# Patient Record
Sex: Male | Born: 1970 | Race: Black or African American | Hispanic: No | Marital: Married | Smoking: Never smoker
Health system: Southern US, Community
[De-identification: ages and names within clinical notes are randomized; demographics above are authoritative.]

## PROBLEM LIST (undated history)

## (undated) HISTORY — PX: KNEE SURGERY: SHX244

## (undated) HISTORY — PX: APPENDECTOMY: SHX54

## (undated) NOTE — ED Notes (Signed)
 Formatting of this note might be different from the original. Report given to meghan, rn Electronically signed by Beydoun, Zienab A at 03/09/2015  3:51 AM EDT

## (undated) NOTE — ED Notes (Signed)
 Formatting of this note might be different from the original. Santina over DC instructions with pt he verbalized understanding Electronically signed by Wyrabkiewicz, Meghan K at 03/09/2015  5:22 AM EDT

## (undated) NOTE — ED Provider Notes (Signed)
 Associated Order(s): [facility] LACERATION REPAIR Formatting of this note is different from the original.   History   Chief Complaint  Patient presents with  ? Head Laceration   Patient is a 44y.o. male presenting with skin laceration. The history is provided by the patient.  Laceration Location:  Face Facial laceration location:  L eyebrow Length (cm):  4 Depth:  Through dermis Quality: jagged   Bleeding: venous and controlled   Time since incident:  4 hours Laceration mechanism:  Metal edge Pain details:    Severity:  No pain Foreign body present:  No foreign bodies Tetanus status:  Out of date  Past Medical History  Diagnosis Date  ? Other activity(E029.9)     VERY ACTIVE ADL'S, GYM CARDIIO, WEIGHTS 3-4 X WEEK   Past Surgical History  Procedure Laterality Date  ? Ohs appendectomy    ? Ohs knee arthroscopy Bilateral    No family history on file.  History  Substance Use Topics  ? Smoking status: Never Smoker   ? Smokeless tobacco: Not on file  ? Alcohol Use: No   Review of Systems  Constitutional: Negative for fever and activity change.  HENT: Positive for facial swelling. Negative for congestion, tinnitus, trouble swallowing and voice change.   Eyes: Negative for pain, discharge and itching.  Respiratory: Negative for apnea, chest tightness, shortness of breath and wheezing.   Cardiovascular: Negative for chest pain, palpitations and leg swelling.  Gastrointestinal: Negative for abdominal pain and abdominal distention.  Genitourinary: Negative for dysuria and difficulty urinating.  Musculoskeletal: Negative for neck pain and neck stiffness.  Skin: Positive for wound. Negative for color change, pallor and rash.  Neurological: Negative for dizziness, seizures, facial asymmetry, light-headedness, numbness and headaches.  Psychiatric/Behavioral: Negative for behavioral problems, confusion, decreased concentration and agitation.   Physical Exam  BP 131/62 mmHg  Pulse  75  Temp(Src) 98 F (36.7 C) (Oral)  Resp 17  SpO2 97%  Physical Exam  Constitutional: He is oriented to person, place, and time. He appears well-developed and well-nourished. No distress.  HENT:  Right Ear: External ear normal.  Left Ear: External ear normal.  Nose: Nose normal.  Mouth/Throat: Oropharynx is clear and moist.  4cm laceration to left forehead  Eyes: Conjunctivae and EOM are normal.  Neck: Normal range of motion. Neck supple.  Cardiovascular: Normal rate, regular rhythm and intact distal pulses.   Pulmonary/Chest: Effort normal and breath sounds normal. He has no wheezes.  Abdominal: Soft. Bowel sounds are normal. He exhibits no distension. There is no tenderness.  Musculoskeletal: Normal range of motion. He exhibits no edema or tenderness.  Neurological: He is alert and oriented to person, place, and time.  Skin: Skin is warm and dry. He is not diaphoretic.  Psychiatric: He has a normal mood and affect. His behavior is normal. Judgment and thought content normal.  Nursing note and vitals reviewed.  ED Course  Laceration repair Date/Time: 03/09/2015 5:06 AM Performed by: FAWAZ, HUSSEIN Authorized by: ARNIE ALM POUR Consent: Verbal consent obtained. Risks and benefits: risks, benefits and alternatives were discussed Consent given by: patient Patient understanding: patient states understanding of the procedure being performed Patient identity confirmed: verbally with patient and arm band Body area: head/neck Location details: left eyebrow Laceration length: 4 cm Foreign bodies: metal Tendon involvement: none Nerve involvement: none Vascular damage: yes Anesthesia: local infiltration Local anesthetic: lidocaine 1% without epinephrine Anesthetic total: 2 ml Patient sedated: no Preparation: Patient was prepped and draped in the usual sterile  fashion. Irrigation solution: saline Irrigation method: jet lavage and syringe Amount of cleaning: extensive Skin  closure: 6-0 nylon Number of sutures: 4 Technique: simple Approximation: close Approximation difficulty: complex Dressing: 4x4 sterile gauze and antibiotic ointment Patient tolerance: Patient tolerated the procedure well with no immediate complications  Other ED Provider Notes  Critical Care Time  MDM  Pt in after he was taking a lock off his steering wheel and it snapped back and hit him in the forehead.  The patient did not have LOC, denies any neck or HA, denies dizziness.  Pt AOx3.  Pt suffered a 4 cm laceration that was repaired with 6-0 Nylon after extensive irrigation.  I have updated the patients Tetanus and he will follow up with his PCP for suture removal.  I have explained applying triple ABX ointment to the area as well.  Pt ambulates with steady gait and appears in no acute distress.     Laceration: Laceration  Diego Drought, NP 03/09/15 0510  I personally evaluated and examined the patient in conjunction with the MLP and agree with the assessment, treatment plan and disposition of the patient as recorded by the [facility].   Arnie Alm POUR, DO 03/09/15 1915 Electronically signed by Diego Drought, RN NP-C at 03/09/2015  5:10 AM EDT Electronically signed by Arnie Alm POUR at 03/09/2015  7:14 PM EDT Electronically signed by Arnie Alm POUR at 03/09/2015  7:14 PM EDT

## (undated) NOTE — ED Notes (Signed)
 Formatting of this note might be different from the original. Discharge follow up call:  Message left. Electronically signed by Loletta Glance C at 03/10/2015  3:41 PM EDT

## (undated) NOTE — ED Notes (Signed)
 Formatting of this note might be different from the original. 44y.o. male patient aox4 presents with complaints of head laceration. Pt states he accidentally hit himself in the head with a golf club about 3 hours ago. 2 cm laceration noted to forehead. No active bleeding at this time. Pt denies pain. No acute distress at this time. Will continue to monitor.  Electronically signed by Beydoun, Zienab A at 03/09/2015  3:07 AM EDT

## (undated) NOTE — ED Notes (Signed)
 Formatting of this note might be different from the original. Bed: 09 Expected date:  Expected time:  Means of arrival:  Comments: Tri: Achor Electronically signed by Charlott Powell CROME, RN at 03/09/2015  3:02 AM EDT

---

## 2017-05-31 ENCOUNTER — Encounter: Payer: Self-pay | Admitting: Emergency Medicine

## 2017-05-31 ENCOUNTER — Emergency Department: Payer: BLUE CROSS/BLUE SHIELD

## 2017-05-31 ENCOUNTER — Emergency Department
Admission: EM | Admit: 2017-05-31 | Discharge: 2017-05-31 | Disposition: A | Discharge status: Home / Self Care | Payer: BLUE CROSS/BLUE SHIELD | Attending: Emergency Medicine | Admitting: Emergency Medicine

## 2017-05-31 DIAGNOSIS — R109 Unspecified abdominal pain: Secondary | ICD-10-CM

## 2017-05-31 DIAGNOSIS — R140 Abdominal distension (gaseous): Secondary | ICD-10-CM | POA: Insufficient documentation

## 2017-05-31 DIAGNOSIS — R197 Diarrhea, unspecified: Secondary | ICD-10-CM | POA: Insufficient documentation

## 2017-05-31 DIAGNOSIS — R110 Nausea: Secondary | ICD-10-CM | POA: Diagnosis not present

## 2017-05-31 DIAGNOSIS — A084 Viral intestinal infection, unspecified: Secondary | ICD-10-CM

## 2017-05-31 LAB — URINALYSIS, COMPLETE (UACMP) WITH MICROSCOPIC
Bacteria, UA: NONE SEEN
Bilirubin Urine: NEGATIVE
GLUCOSE, UA: NEGATIVE mg/dL
HGB URINE DIPSTICK: NEGATIVE
KETONES UR: NEGATIVE mg/dL
LEUKOCYTES UA: NEGATIVE
Nitrite: NEGATIVE
PROTEIN: NEGATIVE mg/dL
Specific Gravity, Urine: 1.014 (ref 1.005–1.030)
Squamous Epithelial / LPF: NONE SEEN
pH: 6 (ref 5.0–8.0)

## 2017-05-31 LAB — COMPREHENSIVE METABOLIC PANEL
ALT: 22 U/L (ref 17–63)
ANION GAP: 6 (ref 5–15)
AST: 30 U/L (ref 15–41)
Albumin: 3.6 g/dL (ref 3.5–5.0)
Alkaline Phosphatase: 62 U/L (ref 38–126)
BUN: 16 mg/dL (ref 6–20)
CALCIUM: 8.6 mg/dL — AB (ref 8.9–10.3)
CHLORIDE: 105 mmol/L (ref 101–111)
CO2: 26 mmol/L (ref 22–32)
CREATININE: 1.32 mg/dL — AB (ref 0.61–1.24)
GFR calc non Af Amer: >60 mL/min (ref >60)
Glucose, Bld: 99 mg/dL (ref 65–99)
Potassium: 3.4 mmol/L — ABNORMAL LOW (ref 3.5–5.1)
SODIUM: 137 mmol/L (ref 135–145)
Total Bilirubin: 0.7 mg/dL (ref 0.3–1.2)
Total Protein: 7.1 g/dL (ref 6.5–8.1)

## 2017-05-31 LAB — CBC
HCT: 42.5 % (ref 40.0–52.0)
HEMOGLOBIN: 14 g/dL (ref 13.0–18.0)
MCH: 28.9 pg (ref 26.0–34.0)
MCHC: 32.9 g/dL (ref 32.0–36.0)
MCV: 87.8 fL (ref 80.0–100.0)
PLATELETS: 193 10*3/uL (ref 150–440)
RBC: 4.84 MIL/uL (ref 4.40–5.90)
RDW: 13.9 % (ref 11.5–14.5)
WBC: 4.4 10*3/uL (ref 3.8–10.6)

## 2017-05-31 LAB — LIPASE, BLOOD: LIPASE: 32 U/L (ref 11–51)

## 2017-05-31 LAB — TROPONIN I

## 2017-05-31 NOTE — ED Triage Notes (Signed)
Patient with complaint of central abdominal pain, bloating, nausea, diarrhea, fever and shortness of breath times three days. Patient reports temperature of 103 at home today. Patient reports he has had 3 or 4 loose stools today.

## 2017-05-31 NOTE — ED Provider Notes (Addendum)
[facility] Emergency Department Provider Note  ____________________________________________   I have reviewed the triage vital signs and the nursing notes. Where available I have reviewed prior notes and, if possible and indicated, outside hospital notes.    HISTORY  Chief Complaint Abdominal Pain and Shortness of Breath    HPI Devin Osborn is a 47 y.o. male is a 46 year old very healthy male who does not follow with doctors regularly, presents today complaining of nausea, diarrhea and a bloating sensation.  He is also had fevers at home.  He is feeling much better now in almost all respects but he still feels somewhat bloated and is having some loose stools.  No melena no bright red blood per rectum no recent travel no recent antibiotics, no ongoing fever this afternoon, he did have one earlier, he is drinking very well.  He is not having abdominal pain but he does feel somewhat "bloated" this feeling is relieved when he has diarrhea which is becoming slightly more formed.  Has been having symptoms for a few days. Positive prior sick contacts.  No prior intervention.  Nothing makes it better, except for some diarrhea, nothing makes his bloating sensation worse except for time.  Otherwise, denies other symptoms    History reviewed. No pertinent past medical history.  There are no active problems to display for this patient.   Past Surgical History:  Procedure Laterality Date  . APPENDECTOMY    . KNEE SURGERY      Prior to Admission medications   Not on File    Allergies Patient has no allergy information on record.  No family history on file.  Social History Social History   Tobacco Use  . Smoking status: Never Smoker  . Smokeless tobacco: Never Used  Substance Use Topics  . Alcohol use: No    Frequency: Never  . Drug use: Yes    Types: Marijuana    Review of Systems Constitutional: No fever/chills Eyes: No visual changes. ENT: No sore  throat. No stiff neck no neck pain Cardiovascular: Denies chest pain. Respiratory: Denies shortness of breath. Gastrointestinal:   no vomiting.  + diarrhea.  No constipation. Genitourinary: Negative for dysuria. Musculoskeletal: Negative lower extremity swelling Skin: Negative for rash. Neurological: Negative for severe headaches, focal weakness or numbness.   ____________________________________________   PHYSICAL EXAM:  VITAL SIGNS: ED Triage Vitals [05/31/17 2054]  Enc Vitals Group     BP 130/83     Pulse Rate 79     Resp 18     Temp 98.8 F (37.1 C)     Temp Source Oral     SpO2 98 %     Weight 183 lb (83 kg)     Height 5\' 9"  (1.753 m)     Head Circumference      Peak Flow      Pain Score 5     Pain Loc      Pain Edu?      Excl. in GC?     Constitutional: Alert and oriented. Well appearing and in no acute distress. Eyes: Conjunctivae are normal Head: Atraumatic HEENT: No congestion/rhinnorhea. Mucous membranes are moist.  Oropharynx non-erythematous Neck:   Nontender with no meningismus, no masses, no stridor Cardiovascular: Normal rate, regular rhythm. Grossly normal heart sounds.  Good peripheral circulation. Respiratory: Normal respiratory effort.  No retractions. Lungs CTAB. Abdominal: Soft and nontender. No distention. No guarding no rebound Back:  There is no focal tenderness or step off.  there is no midline tenderness there are no lesions noted. there is no CVA tenderness U: Normal external genitalia, no lesions noted no masses Musculoskeletal: No lower extremity tenderness, no upper extremity tenderness. No joint effusions, no DVT signs strong distal pulses no edema Neurologic:  Normal speech and language. No gross focal neurologic deficits are appreciated.  Skin:  Skin is warm, dry and intact. No rash noted. Psychiatric: Mood and affect are normal. Speech and behavior are normal.  ____________________________________________   LABS (all labs ordered  are listed, but only abnormal results are displayed)  Labs Reviewed  COMPREHENSIVE METABOLIC PANEL - Abnormal; Notable for the following components:      Result Value   Potassium 3.4 (*)    Creatinine, Ser 1.32 (*)    Calcium 8.6 (*)    All other components within normal limits  URINALYSIS, COMPLETE (UACMP) WITH MICROSCOPIC - Abnormal; Notable for the following components:   Color, Urine YELLOW (*)    APPearance CLEAR (*)    All other components within normal limits  LIPASE, BLOOD  CBC  TROPONIN I    Pertinent labs  results that were available during my care of the patient were reviewed by me and considered in my medical decision making (see chart for details). ____________________________________________  EKG  I personally interpreted any EKGs ordered by me or triage Show a right bundle branch block, no acute ischemic changes, no old for comparison rate 78 patient made aware of findings ____________________________________________  RADIOLOGY  Pertinent labs & imaging results that were available during my care of the patient were reviewed by me and considered in my medical decision making (see chart for details). If possible, patient and/or family made aware of any abnormal findings.  Dg Chest 2 View  Result Date: 05/31/2017 CLINICAL DATA:  Fever and shortness of breath EXAM: CHEST  2 VIEW COMPARISON:  None. FINDINGS: Lungs are clear. Heart size and pulmonary vascularity are normal. No adenopathy. No bone lesions. IMPRESSION: No edema or consolidation. Electronically Signed   By: Bretta Bang III M.D.   On: 05/31/2017 21:16   Dg Abd 2 Views  Result Date: 05/31/2017 CLINICAL DATA:  Central abdominal pain fever EXAM: ABDOMEN - 2 VIEW COMPARISON:  None. FINDINGS: Lung bases demonstrate no acute consolidation or effusion. No free air beneath the diaphragm. Nonobstructed bowel-gas pattern. No abnormal calcifications. IMPRESSION: Nonobstructed bowel-gas pattern Electronically  Signed   By: Jasmine Pang M.D.   On: 05/31/2017 23:30   ____________________________________________    PROCEDURES  Procedure(s) performed: None  Procedures  Critical Care performed: None  ____________________________________________   INITIAL IMPRESSION / ASSESSMENT AND PLAN / ED COURSE  Pertinent labs & imaging results that were available during my care of the patient were reviewed by me and considered in my medical decision making (see chart for details).  Given his benign, patient does have a slight bump in his creatinine but BUN is reassuring and I do not know his baseline he is been made aware of this and the need to follow-up as an outpatient.  However it is the drinking with no difficulty abdomen is nonsurgical despite 3 days of symptoms, blood work is reassuring vital signs are reassuring lab work is reassuring urine is reassuring images are reassuring, considering the patient's symptoms, medical history, and physical examination today, I have low suspicion for cholecystitis or biliary pathology, pancreatitis, perforation or bowel obstruction, hernia, intra-abdominal abscess, AAA or dissection, volvulus or intussusception, mesenteric ischemia, ischemic gut, pyelonephritis or appendicitis.  Return  precautions and follow-up given and understood.    ____________________________________________   FINAL CLINICAL IMPRESSION(S) / ED DIAGNOSES  Final diagnoses:  Abdominal pain      This chart was dictated using voice recognition software.  Despite best efforts to proofread,  errors can occur which can change meaning.      Jeanmarie PlantMcShane, Lon Klippel A, MD 05/31/17 [phone]    Jeanmarie PlantMcShane, Cashmere Dingley A, MD 05/31/17 [phone]

## 2017-05-31 NOTE — ED Notes (Signed)
Pt discharged to home.  Family member driving.  Discharge instructions reviewed.  Verbalized understanding.  No questions or concerns at this time.  Teach back verified.  Pt in NAD.  No items left in ED.   

## 2017-11-23 ENCOUNTER — Other Ambulatory Visit: Payer: Self-pay

## 2017-11-23 ENCOUNTER — Emergency Department
Admission: EM | Admit: 2017-11-23 | Discharge: 2017-11-23 | Disposition: A | Discharge status: Home / Self Care | Payer: BLUE CROSS/BLUE SHIELD | Attending: Emergency Medicine | Admitting: Emergency Medicine

## 2017-11-23 ENCOUNTER — Encounter: Payer: Self-pay | Admitting: *Deleted

## 2017-11-23 DIAGNOSIS — R112 Nausea with vomiting, unspecified: Secondary | ICD-10-CM | POA: Insufficient documentation

## 2017-11-23 DIAGNOSIS — N179 Acute kidney failure, unspecified: Secondary | ICD-10-CM | POA: Insufficient documentation

## 2017-11-23 DIAGNOSIS — E860 Dehydration: Secondary | ICD-10-CM | POA: Insufficient documentation

## 2017-11-23 DIAGNOSIS — R252 Cramp and spasm: Secondary | ICD-10-CM | POA: Diagnosis present

## 2017-11-23 LAB — CBC
HCT: 49.2 % (ref 40.0–52.0)
HEMOGLOBIN: 16.7 g/dL (ref 13.0–18.0)
MCH: 30.2 pg (ref 26.0–34.0)
MCHC: 34 g/dL (ref 32.0–36.0)
MCV: 88.7 fL (ref 80.0–100.0)
Platelets: 381 10*3/uL (ref 150–440)
RBC: 5.55 MIL/uL (ref 4.40–5.90)
RDW: 14 % (ref 11.5–14.5)
WBC: 8.8 10*3/uL (ref 3.8–10.6)

## 2017-11-23 LAB — CK: Total CK: 385 U/L (ref 49–397)

## 2017-11-23 LAB — URINALYSIS, COMPLETE (UACMP) WITH MICROSCOPIC
Bacteria, UA: NONE SEEN
Bilirubin Urine: NEGATIVE
GLUCOSE, UA: NEGATIVE mg/dL
Hgb urine dipstick: NEGATIVE
Ketones, ur: 5 mg/dL — AB
Leukocytes, UA: NEGATIVE
NITRITE: NEGATIVE
PH: 5 (ref 5.0–8.0)
Protein, ur: 30 mg/dL — AB
Specific Gravity, Urine: 1.021 (ref 1.005–1.030)

## 2017-11-23 LAB — BASIC METABOLIC PANEL
ANION GAP: 11 (ref 5–15)
BUN: 28 mg/dL — AB (ref 6–20)
CALCIUM: 10.7 mg/dL — AB (ref 8.9–10.3)
CO2: 26 mmol/L (ref 22–32)
Chloride: 101 mmol/L (ref 98–111)
Creatinine, Ser: 2.18 mg/dL — ABNORMAL HIGH (ref 0.61–1.24)
GFR calc Af Amer: 40 mL/min — ABNORMAL LOW (ref >60)
GFR, EST NON AFRICAN AMERICAN: 35 mL/min — AB (ref >60)
GLUCOSE: 128 mg/dL — AB (ref 70–99)
Potassium: 4.6 mmol/L (ref 3.5–5.1)
SODIUM: 138 mmol/L (ref 135–145)

## 2017-11-23 LAB — MAGNESIUM: Magnesium: 2.6 mg/dL — ABNORMAL HIGH (ref 1.7–2.4)

## 2017-11-23 MED ORDER — SODIUM CHLORIDE 0.9 % IV BOLUS
2000.0000 mL | Freq: Once | INTRAVENOUS | Status: AC
  Administered 2017-11-23: 2000 mL via INTRAVENOUS

## 2017-11-23 MED ORDER — ONDANSETRON 4 MG PO TBDP: ORAL_TABLET | ORAL | 0 refills | Status: DC

## 2017-11-23 NOTE — ED Notes (Signed)
Lab called and informed to add-on CK.  Lab verbalized understanding of this information.

## 2017-11-23 NOTE — Discharge Instructions (Addendum)
As we discussed, it does appear that you are dehydrated.  Your creatinine today is up to 2.18 which is much higher than normal for you.  We discussed to bring you into the hospital for continued IV hydration and to make sure your kidneys are getting better.  However, since you are feeling better and no longer having any nausea or vomiting and able to tolerate fluids, you prefer to go home and follow-up as an outpatient which we feel is appropriate.  However it is very important that you continue to drink plenty of fluids such as water, Gatorade, or Pedialyte, particularly while you are working.  Follow-up with your primary care doctor at the next available opportunity to have repeat lab work drawn to make sure your creatinine is coming back down.  Return to the emergency department if you develop new or worsening symptoms that concern you.

## 2017-11-23 NOTE — ED Notes (Signed)
ED Provider at bedside. 

## 2017-11-23 NOTE — ED Provider Notes (Signed)
[facility] Emergency Department Provider Note  ____________________________________________   First MD Initiated Contact with Patient 11/23/17 2108     (approximate)  I have reviewed the triage vital signs and the nursing notes.   HISTORY  Chief Complaint Dehydration    HPI Devin Osborn is a 47 y.o. male with no chronic medical issues who presents for evaluation of possible dehydration.  He works in [employer] and is mostly up in [employer].  The weather has been extremely hot this week, near 100 degrees every day, and he feels like he is dehydrated.  He started having generalized muscle cramping that is worse in his hands and his legs during the day today.  He has been trying to stay hydrated but his symptoms were severe and rather rapid in onset earlier today.  He was able to drink a bottle of Pedialyte and some Gatorade and feels better, but he did have at least 4 episodes of nausea and vomiting when the symptoms were the worst.  He denies fever/chills, chest pain, shortness of breath, abdominal pain, and dysuria.  He feels like he has been urinating less than usual but he was able to urinate to give Devin Osborn a specimen here in the ED.  He is feeling much better now and is in no acute distress.  Nothing in particular made the symptoms worse and drinking fluids made it better.   No past medical history on file.  There are no active problems to display for this patient.   Past Surgical History:  Procedure Laterality Date  . APPENDECTOMY    . KNEE SURGERY      Prior to Admission medications   Medication Sig Start Date End Date Taking? Authorizing Provider  ondansetron (ZOFRAN ODT) 4 MG disintegrating tablet Allow 1-2 tablets to dissolve in your mouth every 8 hours as needed for nausea/vomiting 11/23/17   Loleta RoseForbach, Konica Stankowski, MD    Allergies Patient has no known allergies.  No family history on file.  Social History Social History   Tobacco Use  .  Smoking status: Never Smoker  . Smokeless tobacco: Never Used  Substance Use Topics  . Alcohol use: No    Frequency: Never  . Drug use: Yes    Types: Marijuana    Review of Systems Constitutional: No fever/chills Eyes: No visual changes. ENT: No sore throat. Cardiovascular: Denies chest pain. Respiratory: Denies shortness of breath. Gastrointestinal: No abdominal pain.  No nausea, no vomiting.  No diarrhea.  No constipation. Genitourinary: Negative for dysuria. Musculoskeletal: Negative for neck pain.  Negative for back pain. Integumentary: Negative for rash. Neurological: Negative for headaches, focal weakness or numbness.   ____________________________________________   PHYSICAL EXAM:  VITAL SIGNS: ED Triage Vitals  Enc Vitals Group     BP 11/23/17 2008 128/83     Pulse Rate 11/23/17 2008 81     Resp 11/23/17 2008 18     Temp 11/23/17 2008 97.8 F (36.6 C)     Temp Source 11/23/17 2008 Oral     SpO2 11/23/17 2008 99 %     Weight 11/23/17 2013 83 kg (183 lb)     Height 11/23/17 2013 1.753 m (5\' 9" )     Head Circumference --      Peak Flow --      Pain Score 11/23/17 2013 9     Pain Loc --      Pain Edu? --      Excl. in GC? --  Constitutional: Alert and oriented. Well appearing and in no acute distress. Eyes: Conjunctivae are normal.  Head: Atraumatic. Nose: No congestion/rhinnorhea. Mouth/Throat: Mucous membranes are a little bit dry but not severely so. Neck: No stridor.  No meningeal signs.   Cardiovascular: Normal rate, regular rhythm. Good peripheral circulation. Grossly normal heart sounds. Respiratory: Normal respiratory effort.  No retractions. Lungs CTAB. Gastrointestinal: Soft and nontender. No distention.  Musculoskeletal: No lower extremity tenderness nor edema. No gross deformities of extremities. Neurologic:  Normal speech and language. No gross focal neurologic deficits are appreciated.  Skin:  Skin is warm, dry and intact. No rash  noted. Psychiatric: Mood and affect are normal. Speech and behavior are normal.  ____________________________________________   LABS (all labs ordered are listed, but only abnormal results are displayed)  Labs Reviewed  BASIC METABOLIC PANEL - Abnormal; Notable for the following components:      Result Value   Glucose, Bld 128 (*)    BUN 28 (*)    Creatinine, Ser 2.18 (*)    Calcium 10.7 (*)    GFR calc non Af Amer 35 (*)    GFR calc Af Amer 40 (*)    All other components within normal limits  URINALYSIS, COMPLETE (UACMP) WITH MICROSCOPIC - Abnormal; Notable for the following components:   Color, Urine AMBER (*)    APPearance CLOUDY (*)    Ketones, ur 5 (*)    Protein, ur 30 (*)    All other components within normal limits  MAGNESIUM - Abnormal; Notable for the following components:   Magnesium 2.6 (*)    All other components within normal limits  CBC  CK   ____________________________________________  EKG  None - EKG not ordered by ED physician ____________________________________________  RADIOLOGY   ED MD interpretation: No indication for imaging  Official radiology report(s): No results found.  ____________________________________________   PROCEDURES  Critical Care performed: No   Procedure(s) performed:   Procedures   ____________________________________________   INITIAL IMPRESSION / ASSESSMENT AND PLAN / ED COURSE  As part of my medical decision making, I reviewed the following data within the electronic MEDICAL RECORD NUMBER History obtained from family, Nursing notes reviewed and incorporated, Labs reviewed , Old chart reviewed and Notes from prior ED visits    Differential diagnosis includes, but is not limited to, acute kidney injury or acute renal failure in the setting of volume depletion, heatstroke or heat exhaustion, electrolyte abnormality, metabolic abnormality, acute infectious process.  Fortunately patient is feeling much better now  that he did before and his vital signs are all within normal limits.  He does have a significant elevation of his creatinine to 2.18 and normal he does not have any renal dysfunction.  His electrolytes are within normal limits.  I added on a CK to make sure there is no sign of rhabdomyolysis and I added on but his potassium and sodium are normal.  Calcium is elevated but not significantly so and IV fluids will help.  I have ordered 2 L of normal saline IV bolus for rehydration.  I explained to him, his wife, and his mother about the results and I did offer admission for continued IV hydration and reassessment.  He prefers to go home and given that he is able to tolerate oral fluids and he is an otherwise healthy and active person, I think that is appropriate.  I did stress the importance of outpatient follow-up.  As long as he does not have rhabdomyolysis and he is  still able to tolerate fluids, I will discharge him after the 2 L of fluid.  He agrees with the plan.  Clinical Course as of Nov 23 2301  Wed Nov 23, 2017  2126 CK Total: 385 [CF]  2202 Magnesium(!): 2.6 [CF]  2224 Patient is almost done with fluids and is feeling much better.  I reiterated my recommendations and he and his wife understand and agree with the plan and return precautions.   [CF]    Clinical Course User Index [CF] Loleta Schecter, MD    ____________________________________________  FINAL CLINICAL IMPRESSION(S) / ED DIAGNOSES  Final diagnoses:  Dehydration  Acute kidney injury (HCC)  Muscle cramps  Non-intractable vomiting with nausea, unspecified vomiting type     MEDICATIONS GIVEN DURING THIS VISIT:  Medications  sodium chloride 0.9 % bolus 2,000 mL (0 mLs Intravenous Stopped 11/23/17 2243)     ED Discharge Orders        Ordered    ondansetron (ZOFRAN ODT) 4 MG disintegrating tablet     11/23/17 2220       Note:  This document was prepared using Dragon voice recognition software and may include  unintentional dictation errors.    Loleta Laventure, MD 11/23/17 2303

## 2017-11-23 NOTE — ED Notes (Signed)
E-sig pad unavailable at this time.  Patient verbalized understanding of all discharge instructions and medications.

## 2017-11-23 NOTE — ED Triage Notes (Signed)
Pt reports working in [employer] and outside this week.  Pt has cramping of hands and abdomen .  Vomiting x 4  Pt alert.

## 2019-11-02 ENCOUNTER — Ambulatory Visit: Payer: BLUE CROSS/BLUE SHIELD | Admitting: Allergy

## 2019-12-21 ENCOUNTER — Ambulatory Visit: Payer: Self-pay | Admitting: Allergy

## 2020-03-10 ENCOUNTER — Other Ambulatory Visit: Payer: Self-pay | Admitting: Physician Assistant

## 2020-03-10 DIAGNOSIS — G501 Atypical facial pain: Secondary | ICD-10-CM

## 2020-03-20 ENCOUNTER — Ambulatory Visit
Admission: RE | Admit: 2020-03-20 | Discharge: 2020-03-20 | Disposition: A | Discharge status: Home / Self Care | Payer: BC Managed Care – PPO | Source: Ambulatory Visit | Attending: Physician Assistant | Admitting: Physician Assistant

## 2020-03-20 ENCOUNTER — Other Ambulatory Visit: Payer: Self-pay

## 2020-03-20 DIAGNOSIS — G501 Atypical facial pain: Secondary | ICD-10-CM | POA: Insufficient documentation

## 2020-03-20 LAB — POCT I-STAT CREATININE: Creatinine, Ser: 1.2 mg/dL (ref 0.61–1.24)

## 2020-03-20 MED ORDER — IOHEXOL 300 MG/ML  SOLN
75.0000 mL | Freq: Once | INTRAMUSCULAR | Status: AC | PRN
  Administered 2020-03-20: 75 mL via INTRAVENOUS

## 2020-04-14 ENCOUNTER — Other Ambulatory Visit
Admission: RE | Admit: 2020-04-14 | Discharge: 2020-04-14 | Disposition: A | Discharge status: Home / Self Care | Payer: BC Managed Care – PPO | Source: Ambulatory Visit | Attending: Ophthalmology | Admitting: Ophthalmology

## 2020-04-14 DIAGNOSIS — H20029 Recurrent acute iridocyclitis, unspecified eye: Secondary | ICD-10-CM | POA: Insufficient documentation

## 2020-04-14 LAB — CBC
HCT: 40.5 % (ref 39.0–52.0)
Hemoglobin: 13.9 g/dL (ref 13.0–17.0)
MCH: 29.6 pg (ref 26.0–34.0)
MCHC: 34.3 g/dL (ref 30.0–36.0)
MCV: 86.4 fL (ref 80.0–100.0)
Platelets: 279 10*3/uL (ref 150–400)
RBC: 4.69 MIL/uL (ref 4.22–5.81)
RDW: 13.2 % (ref 11.5–15.5)
WBC: 4.8 10*3/uL (ref 4.0–10.5)
nRBC: 0 % (ref 0.0–0.2)

## 2020-04-14 LAB — SEDIMENTATION RATE: Sed Rate: 10 mm/hr (ref 0–15)

## 2020-04-15 LAB — RPR: RPR Ser Ql: NONREACTIVE

## 2020-04-15 LAB — ANGIOTENSIN CONVERTING ENZYME: Angiotensin-Converting Enzyme: 40 U/L (ref 14–82)

## 2020-04-15 LAB — RHEUMATOID FACTOR: Rheumatoid fact SerPl-aCnc: <10 IU/mL (ref <14.0)

## 2020-04-16 LAB — QUANTIFERON-TB GOLD PLUS (RQFGPL)
QuantiFERON Mitogen Value: >10 IU/mL
QuantiFERON Nil Value: 0 IU/mL
QuantiFERON TB1 Ag Value: 0.02 IU/mL
QuantiFERON TB2 Ag Value: 0.01 IU/mL

## 2020-04-16 LAB — QUANTIFERON-TB GOLD PLUS: QuantiFERON-TB Gold Plus: NEGATIVE

## 2020-04-23 LAB — MISC LABCORP TEST (SEND OUT): Labcorp test code: 6924

## 2021-09-26 IMAGING — CT CT NECK W/ CM
5 series · 16 of 33 positions shown, 18 images · IV contrast (omnipaque)
Comparison: None.

CLINICAL DATA: Neck pain and pressure in ears for 2 months.

EXAM:
CT NECK WITH CONTRAST
TECHNIQUE: Multidetector CT imaging of the neck was performed using the
standard protocol following the bolus administration of intravenous
contrast.
CONTRAST:  75mL OMNIPAQUE IOHEXOL 300 MG/ML  SOLN

[Series 2: axial neck neck (person_name) 2.00 · axial · 0.63mm/px · z∈[-648,-576]mm · 2 of 108 slices shown]
[im 36/108  bone]
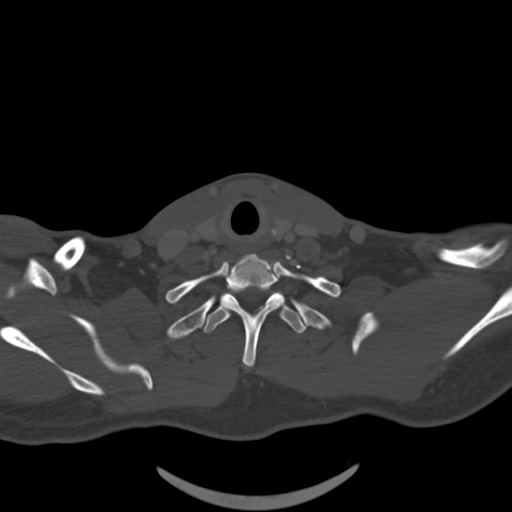
[im 72/108  bone]
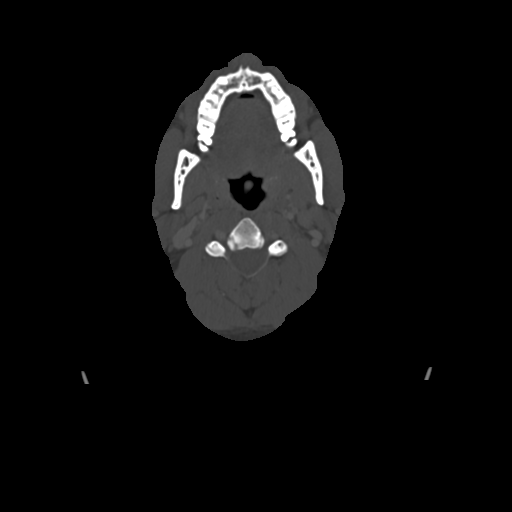

[Series 3: axial bone neck 2.00 · axial · 0.63mm/px · z∈[-648,-576]mm · 2 of 108 slices shown]
[im 36/108  bone]
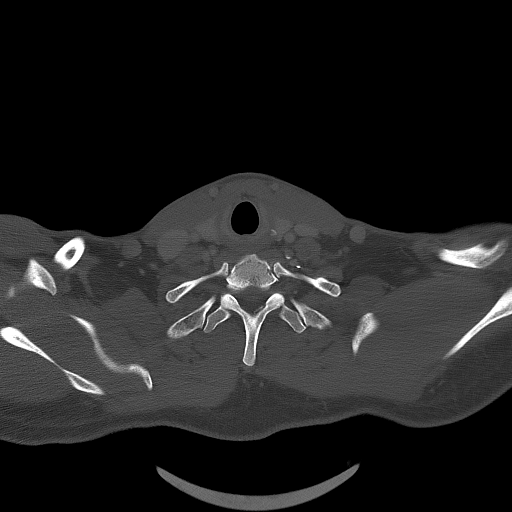
[im 72/108  bone]
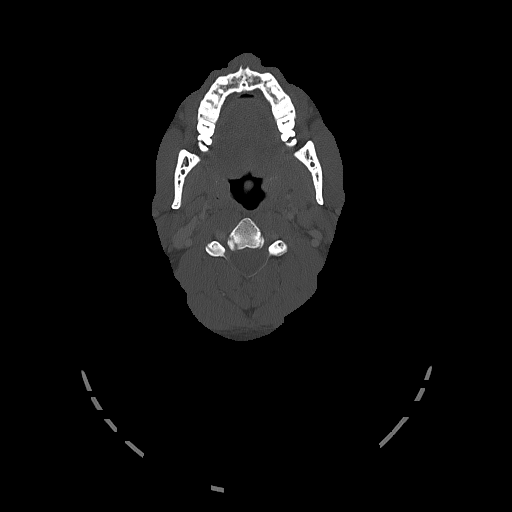

[Series 4: coronal neck neck (person_name) 2.00 cor · coronal · 0.60mm/px · 3 of 155 slices shown]
[im 58/155  bone]
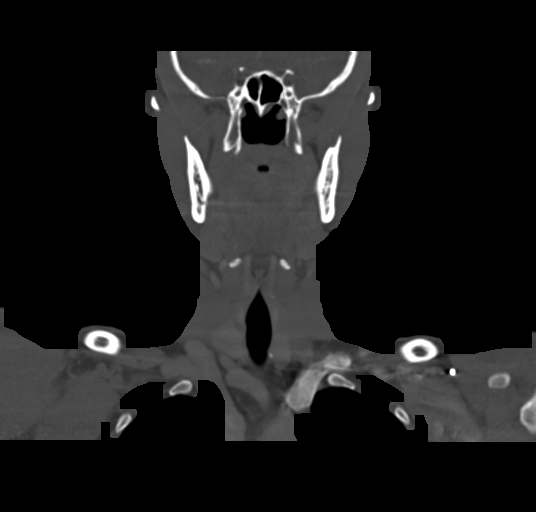
[im 71/155  bone]
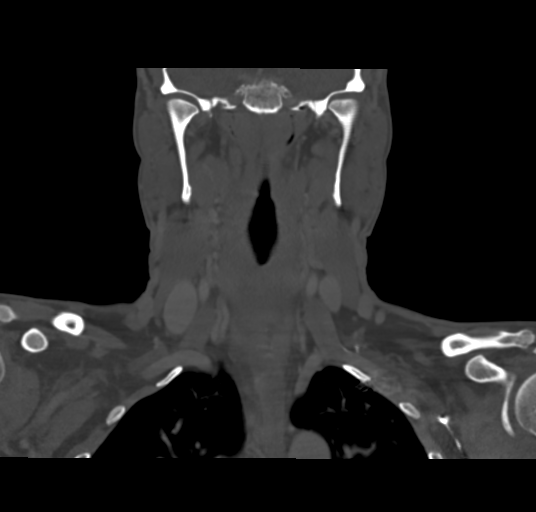
[im 84/155  bone]
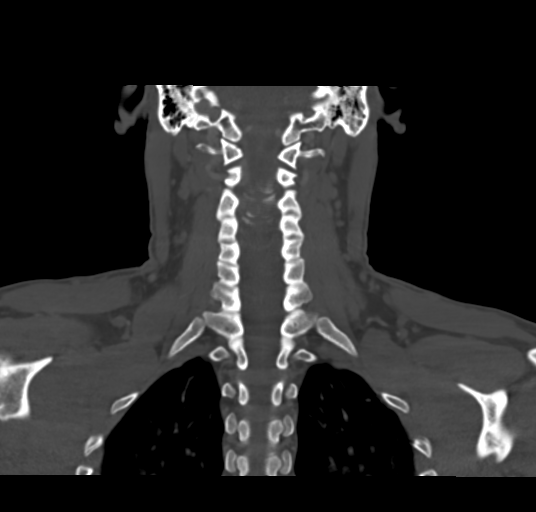

[Series 6: sagittal neck neck (person_name) 2.00 sag · sagittal · 0.60mm/px · 5 of 161 slices shown, 6 images]
[im 54/161  bone]
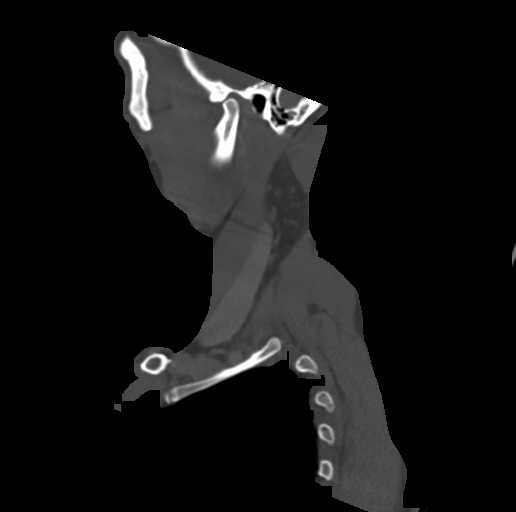
[im 67/161  bone]
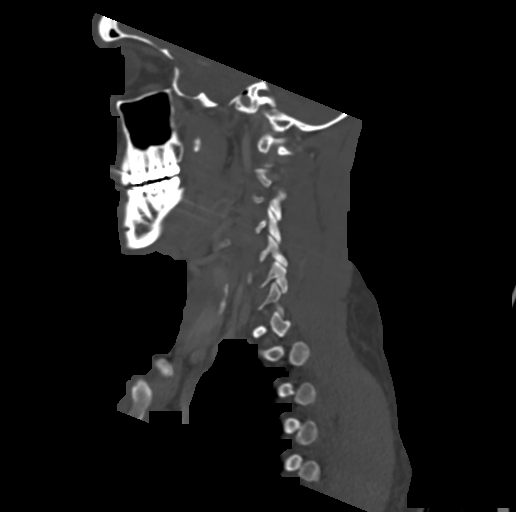
[im 81/161  soft-tissue]
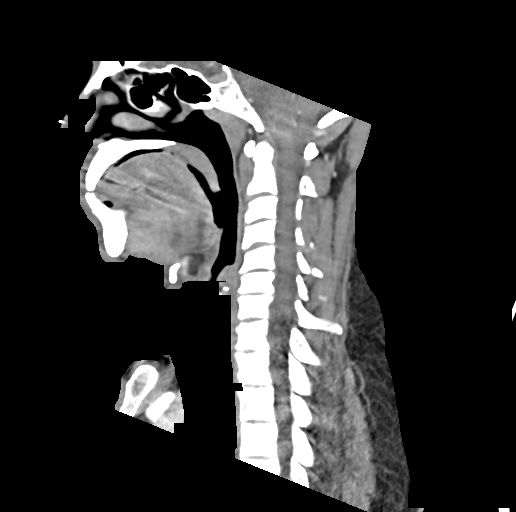
[im 81/161  bone]
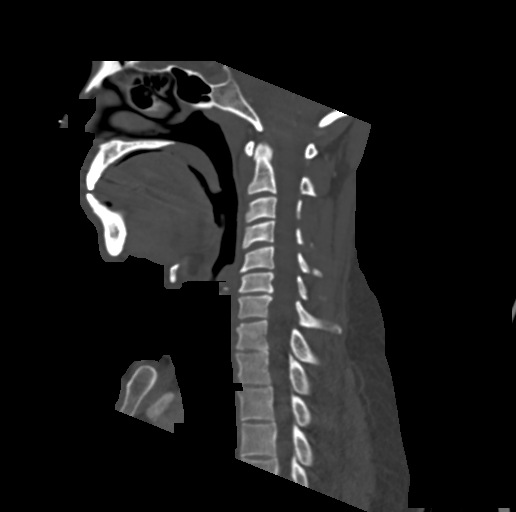
[im 94/161  bone]
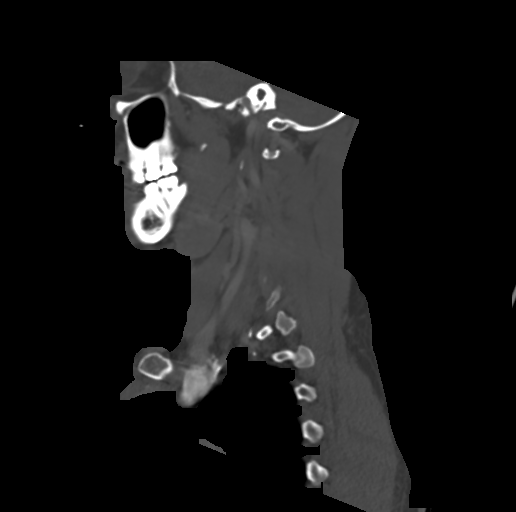
[im 107/161  bone]
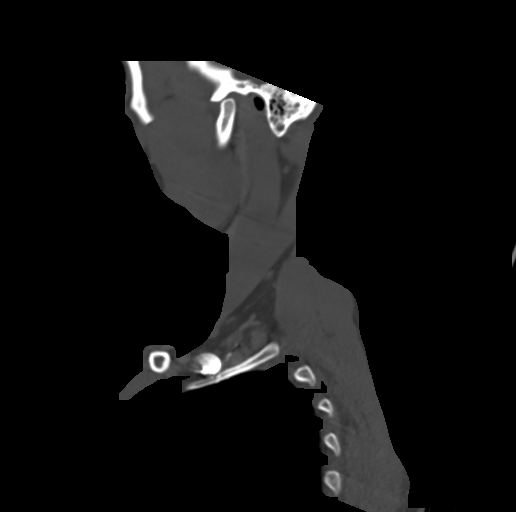

[Series 8: ax oropharynx neck neck (person_name) 2.00 ax · axial · 0.61mm/px · z∈[-746,-576]mm · 4 of 154 slices shown, 5 images]
[im 31/154  soft-tissue]
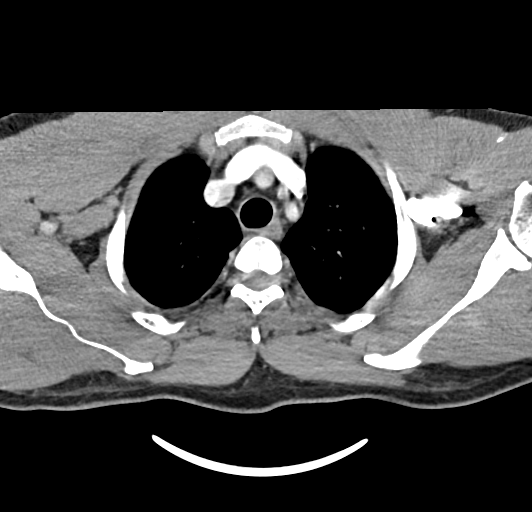
[im 31/154  bone]
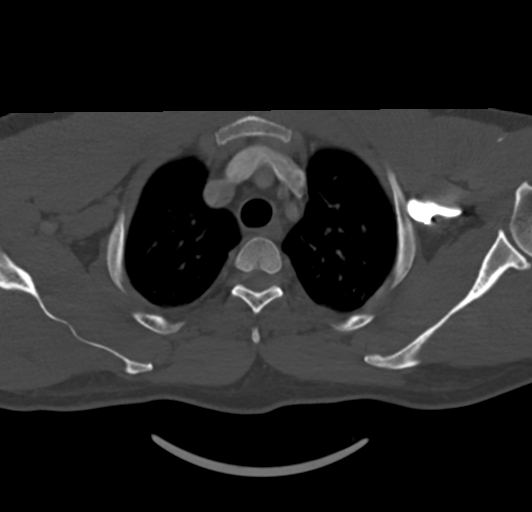
[im 62/154  bone]
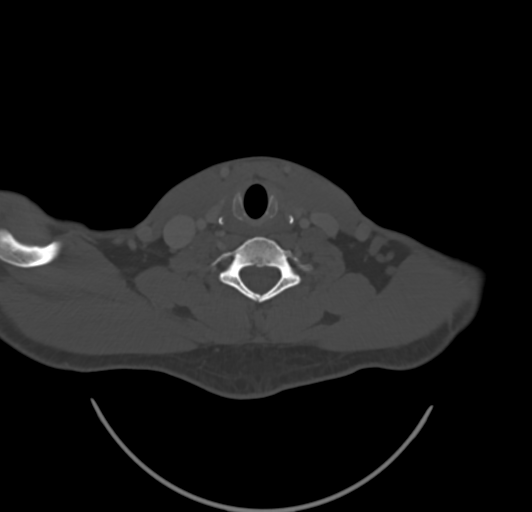
[im 92/154  bone]
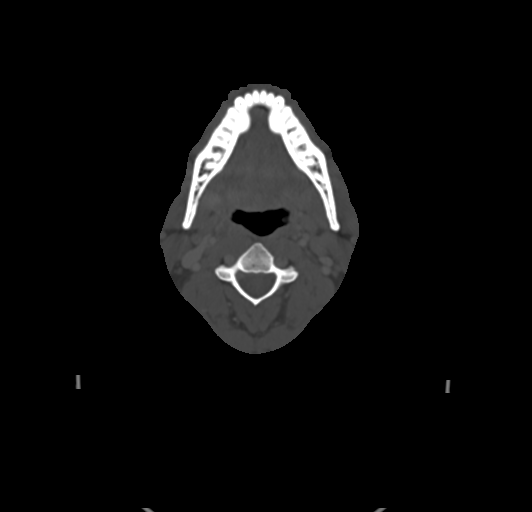
[im 123/154  bone]
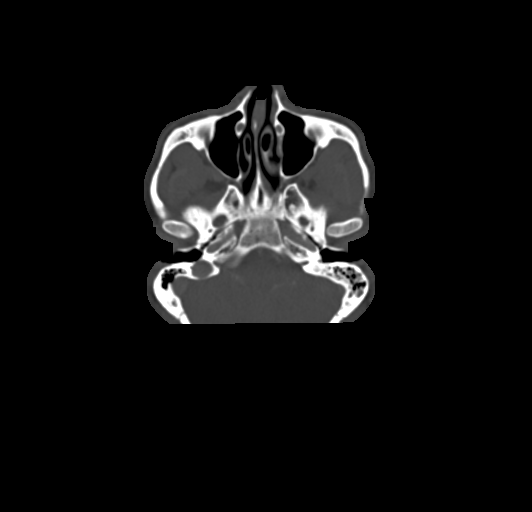

[16 of 33 positions shown; findings below may reference images not displayed]

FINDINGS: Pharynx and larynx: Bilateral tonsilliths. No tonsillar mass. Normal
pharynx and larynx. Normal airway.

Salivary glands: No inflammation, mass, or stone.

Thyroid: Negative

Lymph nodes: No enlarged lymph nodes in the neck.

Vascular: Normal vascular enhancement.

Limited intracranial: Negative

Visualized orbits: Negative

Mastoids and visualized paranasal sinuses: Paranasal sinuses clear.
Left mastoid effusion. Right mastoid clear. Middle ear clear
bilaterally.

Skeleton: Negative

Upper chest: Right apical bleb.  No acute abnormality.

Other: None
IMPRESSION: Negative for mass or adenopathy in the neck

Left mastoid effusion.

## 2024-03-20 ENCOUNTER — Encounter: Payer: Self-pay | Admitting: Podiatry

## 2024-03-20 ENCOUNTER — Ambulatory Visit: Payer: Self-pay | Admitting: Podiatry

## 2024-03-20 ENCOUNTER — Ambulatory Visit (INDEPENDENT_AMBULATORY_CARE_PROVIDER_SITE_OTHER)

## 2024-03-20 VITALS — Ht 69.0 in | Wt 183.0 lb

## 2024-03-20 DIAGNOSIS — M7752 Other enthesopathy of left foot: Secondary | ICD-10-CM

## 2024-03-20 DIAGNOSIS — M2142 Flat foot [pes planus] (acquired), left foot: Secondary | ICD-10-CM

## 2024-03-20 DIAGNOSIS — M2141 Flat foot [pes planus] (acquired), right foot: Secondary | ICD-10-CM | POA: Diagnosis not present

## 2024-03-20 DIAGNOSIS — M19071 Primary osteoarthritis, right ankle and foot: Secondary | ICD-10-CM

## 2024-03-20 DIAGNOSIS — M7751 Other enthesopathy of right foot: Secondary | ICD-10-CM

## 2024-03-20 MED ORDER — BETAMETHASONE SOD PHOS & ACET 6 (3-3) MG/ML IJ SUSP
3.0000 mg | Freq: Once | INTRAMUSCULAR | Status: AC
  Administered 2024-03-20: 3 mg via INTRA_ARTICULAR

## 2024-03-20 MED ORDER — METHYLPREDNISOLONE 4 MG PO TBPK: ORAL_TABLET | ORAL | 0 refills | Status: AC

## 2024-03-20 MED ORDER — MELOXICAM 15 MG PO TABS: 15.0000 mg | ORAL_TABLET | Freq: Every day | ORAL | 1 refills | Status: AC

## 2024-03-20 NOTE — Progress Notes (Addendum)
   Chief Complaint  Patient presents with   Ankle Pain    Pt is here due to right ankle pain, he states that he is also has flat feet, the pain in the ankle has been off and on for years but in the last 4-5 months the pain has been constant, states hard to walk on due to pain, no issue with the left ankle.    Subjective:  53 y.o. male presenting today as a new patient for evaluation of right ankle pain and chronic flatfoot deformity ongoing for several years.   No past medical history on file.     Objective/Physical Exam General: The patient is alert and oriented x3 in no acute distress.  Dermatology: Skin is warm, dry and supple bilateral lower extremities. Negative for open lesions or macerations.  Vascular: Palpable pedal pulses bilaterally. No edema or erythema noted. Capillary refill within normal limits.  Neurological: Grossly intact via light touch  Musculoskeletal Exam: Range of motion within normal limits to all pedal and ankle joints bilateral. Muscle strength 5/5 in all groups bilateral. Upon weightbearing there is a medial longitudinal arch collapse bilaterally. Remove foot valgus noted to the bilateral lower extremities with excessive pronation upon mid stance.  Radiographic Exam B/L ankle and foot 03/20/2024:  Normal osseous mineralization. Joint spaces preserved. No fracture/dislocation/boney destruction.   Pes planus noted on radiographic exam lateral views. Decreased calcaneal inclination and metatarsal declination angle is noted.  Moderate degenerative changes noted throughout the midtarsal joints of the foot  Assessment: 1. pes planus bilateral w/ arthritis 2.  Arthritis right ankle   Plan of Care:  -Patient was evaluated. X-Rays reviewed.  -Injection of 0.5 cc Celestone Soluspan injected in the right ankle medial aspect -Prescription for Medrol Dosepak -Prescription for meloxicam 15 mg daily -Today we discussed conservative treatment management.  Recommend  custom molded orthotics to support the medial longitudinal arch of the foot and potentially alleviate a lot of the patient's symptoms -Prescription for custom molded orthotics provided to the patient to [facility] -Return to clinic annually  *[facility]. I also treat his daughter, Mac.   Thresa EMERSON Sar, DPM [facility]  Dr. Thresa EMERSON Sar, DPM    [address] [place], [place] [address]                Office [phone]  Fax [phone]
